# Patient Record
Sex: Male | Born: 1985 | Race: White | Hispanic: No | Marital: Single | Smoking: Never smoker
Health system: Southern US, Community
[De-identification: ages and names within clinical notes are randomized; demographics above are authoritative.]

## PROBLEM LIST (undated history)

## (undated) DIAGNOSIS — I872 Venous insufficiency (chronic) (peripheral): Secondary | ICD-10-CM

## (undated) HISTORY — PX: SHOULDER SURGERY: SHX246

## (undated) HISTORY — PX: FIBULA FRACTURE SURGERY: SHX947

## (undated) HISTORY — PX: EYE SURGERY: SHX253

## (undated) HISTORY — PX: KNEE SURGERY: SHX244

## (undated) HISTORY — PX: TIBIA FRACTURE SURGERY: SHX806

---

## 2007-01-27 ENCOUNTER — Emergency Department (HOSPITAL_COMMUNITY): Admission: EM | Admit: 2007-01-27 | Discharge: 2007-01-27 | Payer: Self-pay | Admitting: Emergency Medicine

## 2013-09-24 ENCOUNTER — Emergency Department: Payer: Self-pay | Admitting: Emergency Medicine

## 2013-11-05 ENCOUNTER — Emergency Department: Payer: Self-pay | Admitting: Emergency Medicine

## 2016-01-27 ENCOUNTER — Encounter: Payer: Self-pay | Admitting: *Deleted

## 2016-01-27 ENCOUNTER — Emergency Department
Admission: EM | Admit: 2016-01-27 | Discharge: 2016-01-27 | Disposition: A | Discharge status: Home / Self Care | Payer: BC Managed Care – PPO | Attending: Emergency Medicine | Admitting: Emergency Medicine

## 2016-01-27 DIAGNOSIS — X58XXXA Exposure to other specified factors, initial encounter: Secondary | ICD-10-CM | POA: Diagnosis not present

## 2016-01-27 DIAGNOSIS — S46912A Strain of unspecified muscle, fascia and tendon at shoulder and upper arm level, left arm, initial encounter: Secondary | ICD-10-CM

## 2016-01-27 DIAGNOSIS — S46812A Strain of other muscles, fascia and tendons at shoulder and upper arm level, left arm, initial encounter: Secondary | ICD-10-CM | POA: Insufficient documentation

## 2016-01-27 DIAGNOSIS — F410 Panic disorder [episodic paroxysmal anxiety] without agoraphobia: Secondary | ICD-10-CM

## 2016-01-27 DIAGNOSIS — Y939 Activity, unspecified: Secondary | ICD-10-CM | POA: Insufficient documentation

## 2016-01-27 DIAGNOSIS — Y929 Unspecified place or not applicable: Secondary | ICD-10-CM | POA: Diagnosis not present

## 2016-01-27 DIAGNOSIS — Y990 Civilian activity done for income or pay: Secondary | ICD-10-CM | POA: Diagnosis not present

## 2016-01-27 MED ORDER — MELOXICAM 15 MG PO TABS: 15.0000 mg | ORAL_TABLET | Freq: Every day | ORAL | Status: DC

## 2016-01-27 NOTE — Discharge Instructions (Signed)
Muscle Strain A muscle strain is an injury that occurs when a muscle is stretched beyond its normal length. Usually a small number of muscle fibers are torn when this happens. Muscle strain is rated in degrees. First-degree strains have the least amount of muscle fiber tearing and pain. Second-degree and third-degree strains have increasingly more tearing and pain.  Usually, recovery from muscle strain takes 1-2 weeks. Complete healing takes 5-6 weeks.  CAUSES  Muscle strain happens when a sudden, violent force placed on a muscle stretches it too far. This may occur with lifting, sports, or a fall.  RISK FACTORS Muscle strain is especially common in athletes.  SIGNS AND SYMPTOMS At the site of the muscle strain, there may be:  Pain.  Bruising.  Swelling.  Difficulty using the muscle due to pain or lack of normal function. DIAGNOSIS  Your health care provider will perform a physical exam and ask about your medical history. TREATMENT  Often, the best treatment for a muscle strain is resting, icing, and applying cold compresses to the injured area.  HOME CARE INSTRUCTIONS   Use the PRICE method of treatment to promote muscle healing during the first 2-3 days after your injury. The PRICE method involves:  Protecting the muscle from being injured again.  Restricting your activity and resting the injured body part.  Icing your injury. To do this, put ice in a plastic bag. Place a towel between your skin and the bag. Then, apply the ice and leave it on from 15-20 minutes each hour. After the third day, switch to moist heat packs.  Apply compression to the injured area with a splint or elastic bandage. Be careful not to wrap it too tightly. This may interfere with blood circulation or increase swelling.  Elevate the injured body part above the level of your heart as often as you can.  Only take over-the-counter or prescription medicines for pain, discomfort, or fever as directed by your  health care provider.  Warming up prior to exercise helps to prevent future muscle strains. SEEK MEDICAL CARE IF:   You have increasing pain or swelling in the injured area.  You have numbness, tingling, or a significant loss of strength in the injured area. MAKE SURE YOU:   Understand these instructions.  Will watch your condition.  Will get help right away if you are not doing well or get worse.   This information is not intended to replace advice given to you by your health care provider. Make sure you discuss any questions you have with your health care provider.   Document Released: 09/26/2005 Document Revised: 07/17/2013 Document Reviewed: 04/25/2013 Elsevier Interactive Patient Education 2016 [employer]  Panic Attacks Panic attacks are sudden, short-livedsurges of severe anxiety, fear, or discomfort. They may occur for no reason when you are relaxed, when you are anxious, or when you are sleeping. Panic attacks may occur for a number of reasons:   Healthy people occasionally have panic attacks in extreme, life-threatening situations, such as war or natural disasters. Normal anxiety is a protective mechanism of the body that helps us react to danger (fight or flight response).  Panic attacks are often seen with anxiety disorders, such as panic disorder, social anxiety disorder, generalized anxiety disorder, and phobias. Anxiety disorders cause excessive or uncontrollable anxiety. They may interfere with your relationships or other life activities.  Panic attacks are sometimes seen with other mental illnesses, such as depression and posttraumatic stress disorder.  Certain medical conditions, prescription medicines, and  drugs of abuse can cause panic attacks. SYMPTOMS  Panic attacks start suddenly, peak within 20 minutes, and are accompanied by four or more of the following symptoms:  Pounding heart or fast heart rate (palpitations).  Sweating.  Trembling or  shaking.  Shortness of breath or feeling smothered.  Feeling choked.  Chest pain or discomfort.  Nausea or strange feeling in your stomach.  Dizziness, light-headedness, or feeling like you will faint.  Chills or hot flushes.  Numbness or tingling in your lips or hands and feet.  Feeling that things are not real or feeling that you are not yourself.  Fear of losing control or going crazy.  Fear of dying. Some of these symptoms can mimic serious medical conditions. For example, you may think you are having a heart attack. Although panic attacks can be very scary, they are not life threatening. DIAGNOSIS  Panic attacks are diagnosed through an assessment by your health care provider. Your health care provider will ask questions about your symptoms, such as where and when they occurred. Your health care provider will also ask about your medical history and use of alcohol and drugs, including prescription medicines. Your health care provider may order blood tests or other studies to rule out a serious medical condition. Your health care provider may refer you to a mental health professional for further evaluation. TREATMENT   Most healthy people who have one or two panic attacks in an extreme, life-threatening situation will not require treatment.  The treatment for panic attacks associated with anxiety disorders or other mental illness typically involves counseling with a mental health professional, medicine, or a combination of both. Your health care provider will help determine what treatment is best for you.  Panic attacks due to physical illness usually go away with treatment of the illness. If prescription medicine is causing panic attacks, talk with your health care provider about stopping the medicine, decreasing the dose, or substituting another medicine.  Panic attacks due to alcohol or drug abuse go away with abstinence. Some adults need professional help in order to stop  drinking or using drugs. HOME CARE INSTRUCTIONS   Take all medicines as directed by your health care provider.   Schedule and attend follow-up visits as directed by your health care provider. It is important to keep all your appointments. SEEK MEDICAL CARE IF:  You are not able to take your medicines as prescribed.  Your symptoms do not improve or get worse. SEEK IMMEDIATE MEDICAL CARE IF:   You experience panic attack symptoms that are different than your usual symptoms.  You have serious thoughts about hurting yourself or others.  You are taking medicine for panic attacks and have a serious side effect. MAKE SURE YOU:  Understand these instructions.  Will watch your condition.  Will get help right away if you are not doing well or get worse.   This information is not intended to replace advice given to you by your health care provider. Make sure you discuss any questions you have with your health care provider.   Document Released: 09/26/2005 Document Revised: 10/01/2013 Document Reviewed: 05/10/2013 Elsevier Interactive Patient Education [employer]

## 2016-01-27 NOTE — ED Notes (Signed)
Pt reports he had a panic attack today.  Pt reports he had episode today of tremors.  Pt states i have a stressful job and now i need a note for work. Pt calm and cooperative.
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
## 2016-09-26 ENCOUNTER — Ambulatory Visit (INDEPENDENT_AMBULATORY_CARE_PROVIDER_SITE_OTHER): Payer: BC Managed Care – PPO | Admitting: Vascular Surgery

## 2016-09-26 ENCOUNTER — Encounter (INDEPENDENT_AMBULATORY_CARE_PROVIDER_SITE_OTHER): Payer: Self-pay | Admitting: Vascular Surgery

## 2016-09-26 ENCOUNTER — Encounter (INDEPENDENT_AMBULATORY_CARE_PROVIDER_SITE_OTHER): Payer: Self-pay

## 2016-09-26 VITALS — BP 120/76 | HR 65 | Resp 17 | Ht 73.0 in | Wt 330.0 lb

## 2016-09-26 DIAGNOSIS — I872 Venous insufficiency (chronic) (peripheral): Secondary | ICD-10-CM

## 2016-09-26 DIAGNOSIS — I8393 Asymptomatic varicose veins of bilateral lower extremities: Secondary | ICD-10-CM

## 2016-09-26 DIAGNOSIS — M7989 Other specified soft tissue disorders: Secondary | ICD-10-CM
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
## 2016-10-24 ENCOUNTER — Ambulatory Visit (INDEPENDENT_AMBULATORY_CARE_PROVIDER_SITE_OTHER): Payer: Self-pay | Admitting: Vascular Surgery

## 2016-10-24 ENCOUNTER — Encounter (INDEPENDENT_AMBULATORY_CARE_PROVIDER_SITE_OTHER): Payer: BC Managed Care – PPO

## 2016-10-24 ENCOUNTER — Encounter (INDEPENDENT_AMBULATORY_CARE_PROVIDER_SITE_OTHER): Payer: Self-pay

## 2017-01-20 ENCOUNTER — Encounter: Payer: Self-pay | Admitting: Medical Oncology

## 2017-01-20 ENCOUNTER — Emergency Department
Admission: EM | Admit: 2017-01-20 | Discharge: 2017-01-20 | Disposition: A | Discharge status: Home / Self Care | Payer: BC Managed Care – PPO | Attending: Emergency Medicine | Admitting: Emergency Medicine

## 2017-01-20 DIAGNOSIS — J069 Acute upper respiratory infection, unspecified: Secondary | ICD-10-CM | POA: Insufficient documentation

## 2017-01-20 DIAGNOSIS — B9789 Other viral agents as the cause of diseases classified elsewhere: Secondary | ICD-10-CM

## 2017-01-20 DIAGNOSIS — L309 Dermatitis, unspecified: Secondary | ICD-10-CM

## 2017-01-20 MED ORDER — GUAIFENESIN-CODEINE 100-10 MG/5ML PO SOLN: 5.0000 mL | ORAL | 0 refills | Status: DC | PRN

## 2017-01-20 MED ORDER — TRIAMCINOLONE ACETONIDE 0.5 % EX OINT: 1.0000 "application " | TOPICAL_OINTMENT | Freq: Two times a day (BID) | CUTANEOUS | 0 refills | Status: DC

## 2017-01-20 MED ORDER — ONDANSETRON 4 MG PO TBDP: 4.0000 mg | ORAL_TABLET | Freq: Three times a day (TID) | ORAL | 0 refills | Status: DC | PRN

## 2017-01-20 MED ORDER — ONDANSETRON 8 MG PO TBDP
8.0000 mg | ORAL_TABLET | Freq: Once | ORAL | Status: AC
  Administered 2017-01-20: 8 mg via ORAL
  Filled 2017-01-20: qty 1

## 2017-01-20 NOTE — ED Triage Notes (Signed)
Pt reports sore throat, cough, congestion since Tuesday. Also reports rash that is itchy to both feet.

## 2017-01-20 NOTE — ED Provider Notes (Signed)
[facility] Emergency Department Provider Note   ____________________________________________   First MD Initiated Contact with Patient 01/20/17 [phone]     (approximate)  I have reviewed the triage vital signs and the nursing notes.   HISTORY  Chief Complaint Cough; Nasal Congestion; Sore Throat; and Rash    HPI Jacob Richard is a 31 y.o. male is here complaining of sore throat, cough, congestion for the last 3 days. Patient denies any fever. He has been taking Mucinex for his symptoms without any relief. He states that he has been coughing a great deal and occasionally has vomited because of the coughing. This morning was last time he vomited. Patient voices that he is still nauseous at this time. He denies any diarrhea. Patient also is requesting that the rash on his feet be looked at also. Rash is been on his feet for the last 2-3 months. Patient states that it itches a great deal and seems to be getting worse. Patient thought it was ringworm and has been using over-the-counter cream without any relief or improvement. He denies having a PCP. Patient is a nonsmoker. At this time he denies any pain.   History reviewed. No pertinent past medical history.  Patient Active Problem List   Diagnosis Date Noted  . Chronic venous insufficiency 09/26/2016  . Swelling of lower extremity 09/26/2016  . Varicose veins of both lower extremities 09/26/2016    No past surgical history on file.  Prior to Admission medications   Medication Sig Start Date End Date Taking? Authorizing Provider  guaiFENesin-codeine 100-10 MG/5ML syrup Take 5 mLs by mouth every 4 (four) hours as needed. 01/20/17   Tommi Rumps, PA-C  Multiple Vitamin (MULTIVITAMIN) capsule Take by mouth.    Historical Provider, MD  ondansetron (ZOFRAN ODT) 4 MG disintegrating tablet Take 1 tablet (4 mg total) by mouth every 8 (eight) hours as needed for nausea or vomiting. 01/20/17   Tommi Rumps, PA-C    triamcinolone ointment (KENALOG) 0.5 % Apply 1 application topically 2 (two) times daily. 01/20/17   Tommi Rumps, PA-C    Allergies Patient has no known allergies.  No family history on file.  Social History Social History  Substance Use Topics  . Smoking status: Never Smoker  . Smokeless tobacco: Not on file  . Alcohol use Yes    Review of Systems Constitutional: No fever/chills Eyes: No visual changes. ENT: Positive sore throat. Positive nasal congestion. Cardiovascular: Denies chest pain. Respiratory: Denies shortness of breath. Positive cough. Gastrointestinal: No abdominal pain.  Positive nausea, positive vomiting.  No diarrhea.   Musculoskeletal: Negative for back pain. Skin: Negative for rash. Neurological: Negative for headaches, focal weakness or numbness.  10-point ROS otherwise negative.  ____________________________________________   PHYSICAL EXAM:  VITAL SIGNS: ED Triage Vitals [01/20/17 0755]  Enc Vitals Group     BP 128/68     Pulse Rate 84     Resp 18     Temp 98.6 F (37 C)     Temp Source Oral     SpO2 97 %     Weight (!) 320 lb (145.2 kg)     Height  (1.854 m)     Head Circumference      Peak Flow      Pain Score 0     Pain Loc      Pain Edu?      Excl. in GC?     Constitutional: Alert and oriented. Well  appearing and in no acute distress. Eyes: Conjunctivae are normal. PERRL. EOMI. Head: Atraumatic. Nose: Moderate congestion/no rhinnorhea.  EACs and TMs are clear bilaterally. Mouth/Throat: Mucous membranes are moist.  Oropharynx non-erythematous. Posterior drainage seen. Neck: No stridor.   Hematological/Lymphatic/Immunilogical: No cervical lymphadenopathy. Cardiovascular: Normal rate, regular rhythm. Grossly normal heart sounds.  Good peripheral circulation. Respiratory: Normal respiratory effort.  No retractions. Lungs CTAB. Gastrointestinal: Soft and nontender. No distention. Bowel sounds normoactive 4  quadrants. Musculoskeletal: No lower extremity tenderness nor edema.  No joint effusions. Neurologic:  Normal speech and language. No gross focal neurologic deficits are appreciated. No gait instability. Skin:  Skin is warm, dry and intact. Dorsal aspect of both feet have erythematous patches that are extremely dry. Margins are clear defined. No drainage is noted. No warmth or tenderness noted. Psychiatric: Mood and affect are normal. Speech and behavior are normal.  ____________________________________________   LABS (all labs ordered are listed, but only abnormal results are displayed)  Labs Reviewed - No data to display   PROCEDURES  Procedure(s) performed: None  Procedures  Critical Care performed: No  ____________________________________________   INITIAL IMPRESSION / ASSESSMENT AND PLAN / ED COURSE  Pertinent labs & imaging results that were available during my care of the patient were reviewed by me and considered in my medical decision making (see chart for details).  Patient was given Zofran while in the emergency room with improvement of his nausea. He was discharged with prescription for Zofran 4 mg ODT every 8 hours as needed for nausea.  He was given a prescription for Robitussin-AC 1 teaspoon every 4 hours as needed for cough and congestion. And for his skin infection on his feet he was given a prescription for Kenalog 0.5% apply to area twice a day. He was given instructions to follow-up with one of the dermatologist in [place] and their phone numbers were given to him.      ____________________________________________   FINAL CLINICAL IMPRESSION(S) / ED DIAGNOSES  Final diagnoses:  Viral URI with cough  Eczema, unspecified type      NEW MEDICATIONS STARTED DURING THIS VISIT:  Discharge Medication List as of 01/20/2017  8:22 AM    START taking these medications   Details  guaiFENesin-codeine 100-10 MG/5ML syrup Take 5 mLs by mouth every 4 (four)  hours as needed., Starting Fri 01/20/2017, Print    ondansetron (ZOFRAN ODT) 4 MG disintegrating tablet Take 1 tablet (4 mg total) by mouth every 8 (eight) hours as needed for nausea or vomiting., Starting Fri 01/20/2017, Print    triamcinolone ointment (KENALOG) 0.5 % Apply 1 application topically 2 (two) times daily., Starting Fri 01/20/2017, Print         Note:  This document was prepared using Dragon voice recognition software and may include unintentional dictation errors.    Tommi Rumps, PA-C 01/20/17 1224    Jeanmarie Plant, MD 01/20/17 [phone]

## 2017-01-20 NOTE — Discharge Instructions (Signed)
Clear liquids for the remainder of the day. If able to keep liquids down and then advance diet slowly to crackers, toast, rice, applesauce. Tylenol if needed for body aches or fever. Zofran if needed for nausea or vomiting. Being using cream to your feet twice a day. Call and make an appointment with Dr. Durene Cal office or Dr. Philemon Kingdom office. You will need to see a dermatologist if not improving.

## 2018-07-11 ENCOUNTER — Encounter: Payer: Self-pay | Admitting: Emergency Medicine

## 2018-07-11 ENCOUNTER — Ambulatory Visit
Admission: EM | Admit: 2018-07-11 | Discharge: 2018-07-11 | Disposition: A | Discharge status: Home / Self Care | Payer: BLUE CROSS/BLUE SHIELD | Attending: Family Medicine | Admitting: Family Medicine

## 2018-07-11 ENCOUNTER — Other Ambulatory Visit: Payer: Self-pay

## 2018-07-11 DIAGNOSIS — R030 Elevated blood-pressure reading, without diagnosis of hypertension: Secondary | ICD-10-CM | POA: Diagnosis not present

## 2018-07-11 DIAGNOSIS — I872 Venous insufficiency (chronic) (peripheral): Secondary | ICD-10-CM | POA: Diagnosis not present

## 2018-07-11 DIAGNOSIS — L0889 Other specified local infections of the skin and subcutaneous tissue: Secondary | ICD-10-CM

## 2018-07-11 DIAGNOSIS — L03116 Cellulitis of left lower limb: Secondary | ICD-10-CM | POA: Diagnosis not present

## 2018-07-11 HISTORY — DX: Rider (driver) (passenger) of other motorcycle injured in unspecified traffic accident, initial encounter: V29.99XA

## 2018-07-11 HISTORY — DX: Venous insufficiency (chronic) (peripheral): I87.2

## 2018-07-11 MED ORDER — CEPHALEXIN 500 MG PO CAPS: 500.0000 mg | ORAL_CAPSULE | Freq: Three times a day (TID) | ORAL | 0 refills | Status: AC
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
## 2018-07-11 NOTE — ED Triage Notes (Signed)
Patient c/o hitting his left leg on "something" and now has swelling and drainage to the left lower leg. Patient stated he has had the swelling x 1 week but the drainage started today. Patient has blister to the left lower ankle area.

## 2018-07-11 NOTE — Discharge Instructions (Addendum)
Recommend start Keflex 500mg  3 times a day as directed. Stop oral Prednisone for now. May continue to apply Kenalog (steroid) cream on other affected areas on left leg. Keep area clean and change dressings frequently. Keep left leg elevated as much as possible. May take Ibuprofen 600mg  every 6 hours as needed for pain. Follow-up with [facility] or your Dermatologist as soon as possible for recheck.

## 2021-05-11 ENCOUNTER — Emergency Department: Payer: BC Managed Care – PPO

## 2021-05-11 ENCOUNTER — Other Ambulatory Visit: Payer: Self-pay

## 2021-05-11 ENCOUNTER — Emergency Department
Admission: EM | Admit: 2021-05-11 | Discharge: 2021-05-11 | Disposition: A | Discharge status: Home / Self Care | Payer: BC Managed Care – PPO | Attending: Emergency Medicine | Admitting: Emergency Medicine

## 2021-05-11 DIAGNOSIS — R059 Cough, unspecified: Secondary | ICD-10-CM | POA: Diagnosis present

## 2021-05-11 DIAGNOSIS — R112 Nausea with vomiting, unspecified: Secondary | ICD-10-CM | POA: Diagnosis not present

## 2021-05-11 DIAGNOSIS — R1012 Left upper quadrant pain: Secondary | ICD-10-CM | POA: Insufficient documentation

## 2021-05-11 DIAGNOSIS — U071 COVID-19: Secondary | ICD-10-CM | POA: Diagnosis not present

## 2021-05-11 DIAGNOSIS — R000 Tachycardia, unspecified: Secondary | ICD-10-CM | POA: Insufficient documentation

## 2021-05-11 LAB — COMPREHENSIVE METABOLIC PANEL
ALT: 35 U/L (ref 0–44)
AST: 29 U/L (ref 15–41)
Albumin: 4.6 g/dL (ref 3.5–5.0)
Alkaline Phosphatase: 81 U/L (ref 38–126)
Anion gap: 5 (ref 5–15)
BUN: 11 mg/dL (ref 6–20)
CO2: 23 mmol/L (ref 22–32)
Calcium: 9.3 mg/dL (ref 8.9–10.3)
Chloride: 107 mmol/L (ref 98–111)
Creatinine, Ser: 0.98 mg/dL (ref 0.61–1.24)
GFR, Estimated: >60 mL/min (ref >60)
Glucose, Bld: 109 mg/dL — ABNORMAL HIGH (ref 70–99)
Potassium: 4.2 mmol/L (ref 3.5–5.1)
Sodium: 135 mmol/L (ref 135–145)
Total Bilirubin: 0.6 mg/dL (ref 0.3–1.2)
Total Protein: 7.9 g/dL (ref 6.5–8.1)

## 2021-05-11 LAB — CBC
HCT: 41.9 % (ref 39.0–52.0)
Hemoglobin: 14.2 g/dL (ref 13.0–17.0)
MCH: 28.9 pg (ref 26.0–34.0)
MCHC: 33.9 g/dL (ref 30.0–36.0)
MCV: 85.2 fL (ref 80.0–100.0)
Platelets: 206 10*3/uL (ref 150–400)
RBC: 4.92 MIL/uL (ref 4.22–5.81)
RDW: 12.2 % (ref 11.5–15.5)
WBC: 7.1 10*3/uL (ref 4.0–10.5)
nRBC: 0 % (ref 0.0–0.2)

## 2021-05-11 LAB — URINALYSIS, COMPLETE (UACMP) WITH MICROSCOPIC
Bacteria, UA: NONE SEEN
Bilirubin Urine: NEGATIVE
Glucose, UA: NEGATIVE mg/dL
Hgb urine dipstick: NEGATIVE
Ketones, ur: NEGATIVE mg/dL
Leukocytes,Ua: NEGATIVE
Nitrite: NEGATIVE
Protein, ur: NEGATIVE mg/dL
Specific Gravity, Urine: 1.02 (ref 1.005–1.030)
pH: 5 (ref 5.0–8.0)

## 2021-05-11 LAB — RESP PANEL BY RT-PCR (FLU A&B, COVID) ARPGX2
Influenza A by PCR: NEGATIVE
Influenza B by PCR: NEGATIVE
SARS Coronavirus 2 by RT PCR: POSITIVE — AB

## 2021-05-11 LAB — LIPASE, BLOOD: Lipase: 27 U/L (ref 11–51)

## 2021-05-11 MED ORDER — PANTOPRAZOLE SODIUM 40 MG PO TBEC
40.0000 mg | DELAYED_RELEASE_TABLET | Freq: Once | ORAL | Status: AC
  Administered 2021-05-11: 40 mg via ORAL
  Filled 2021-05-11: qty 1

## 2021-05-11 MED ORDER — SUCRALFATE 1 G PO TABS
1.0000 g | ORAL_TABLET | Freq: Once | ORAL | Status: AC
  Administered 2021-05-11: 1 g via ORAL
  Filled 2021-05-11: qty 1

## 2021-05-11 MED ORDER — FENTANYL CITRATE (PF) 100 MCG/2ML IJ SOLN
50.0000 ug | Freq: Once | INTRAMUSCULAR | Status: DC
  Filled 2021-05-11 (×2): qty 2

## 2021-05-11 MED ORDER — LACTATED RINGERS IV BOLUS
1000.0000 mL | Freq: Once | INTRAVENOUS | Status: AC
  Administered 2021-05-11: 1000 mL via INTRAVENOUS

## 2021-05-11 MED ORDER — IOHEXOL 350 MG/ML SOLN
100.0000 mL | Freq: Once | INTRAVENOUS | Status: AC | PRN
  Administered 2021-05-11: 100 mL via INTRAVENOUS
  Filled 2021-05-11: qty 100

## 2021-05-11 MED ORDER — ONDANSETRON HCL 4 MG PO TABS: 4.0000 mg | ORAL_TABLET | Freq: Three times a day (TID) | ORAL | 0 refills | Status: AC | PRN

## 2021-05-11 MED ORDER — PAXLOVID 10 X 150 MG & 10 X 100MG PO TBPK: 2.0000 | ORAL_TABLET | Freq: Two times a day (BID) | ORAL | 0 refills | Status: AC
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
## 2021-05-11 NOTE — ED Triage Notes (Signed)
Pt to ED for n/v and LLQ pain that started an hour ago. Pt inNAD

## 2022-01-15 IMAGING — CR DG CHEST 2V
1 series · 2 of 2 positions shown · non-contrast
Comparison: 11/05/2013

CLINICAL DATA: Cough.  Abdominal pain.

EXAM:
CHEST - 2 VIEW

[Series 1: dg chest 2 view · 0.14mm/px · 2 of 2 slices shown]
[im 1/2]
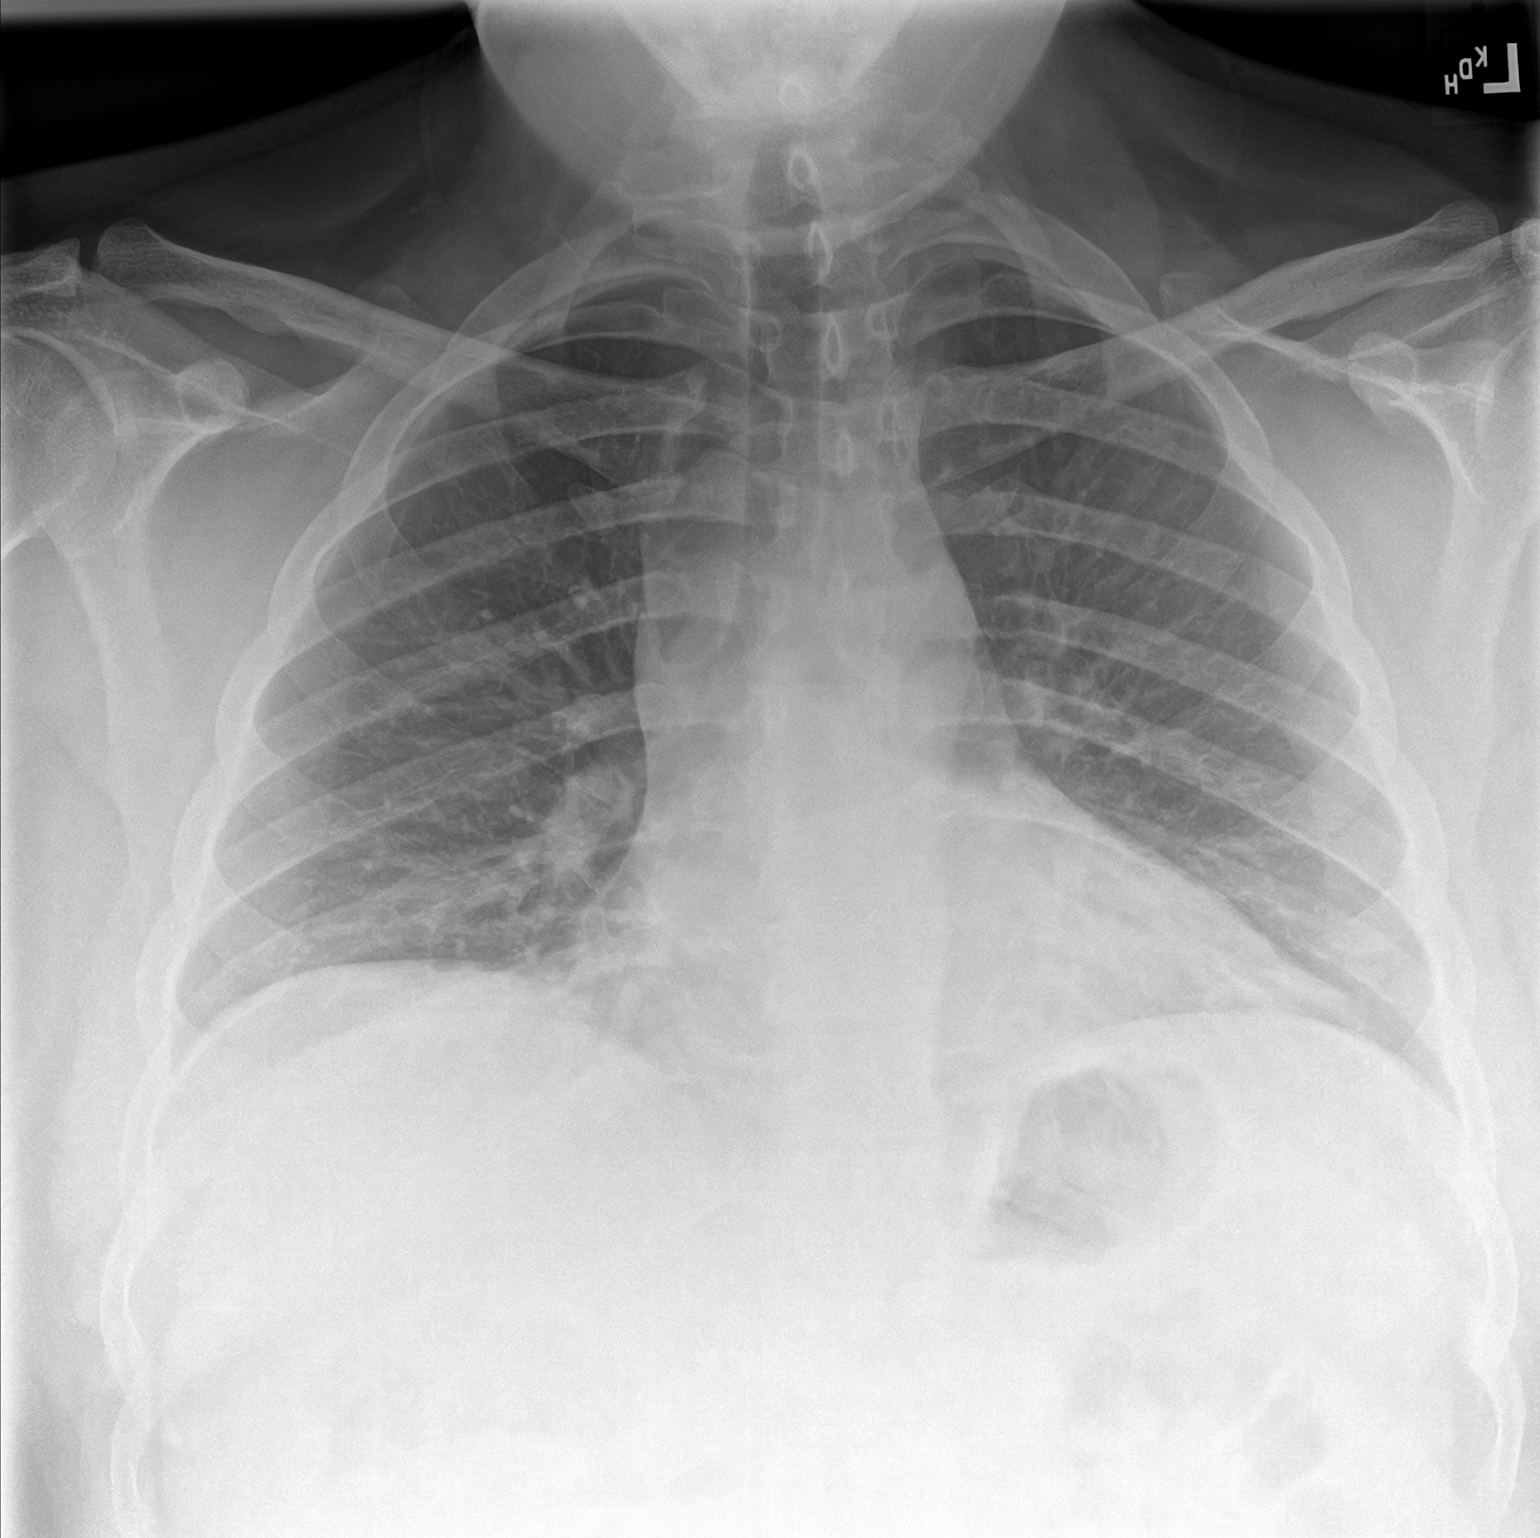
[im 2/2]
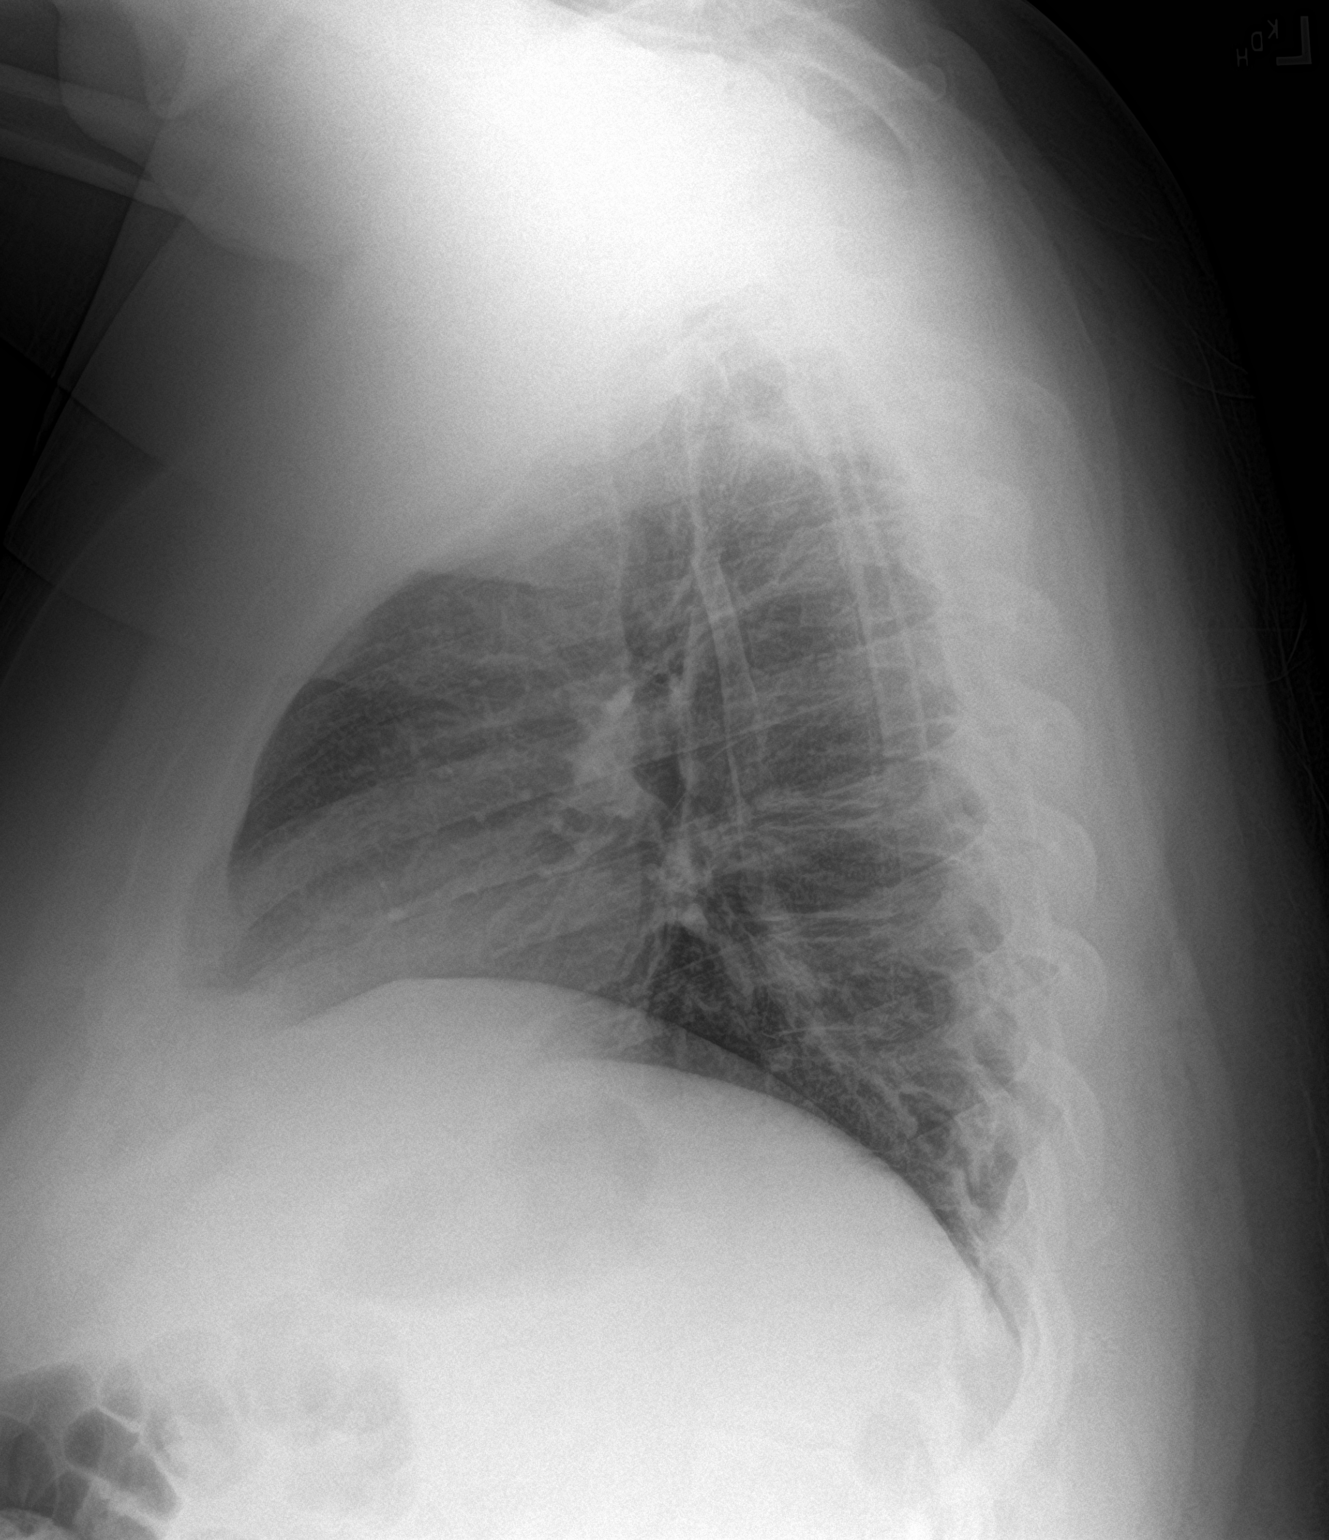

[2 of 2 positions shown; findings below may reference images not displayed]

FINDINGS: Heart size is normal. Mediastinal shadows are normal. The lungs are
clear. No bronchial thickening. No infiltrate, mass, effusion or
collapse. Pulmonary vascularity is normal. No bony abnormality.
IMPRESSION: Normal chest

## 2022-03-21 ENCOUNTER — Other Ambulatory Visit: Payer: Self-pay | Admitting: Gerontology

## 2022-03-21 ENCOUNTER — Ambulatory Visit
Admission: RE | Admit: 2022-03-21 | Discharge: 2022-03-21 | Disposition: A | Discharge status: Home / Self Care | Payer: BC Managed Care – PPO | Source: Ambulatory Visit | Attending: Gerontology | Admitting: Gerontology

## 2022-03-21 DIAGNOSIS — I83812 Varicose veins of left lower extremities with pain: Secondary | ICD-10-CM | POA: Insufficient documentation

## 2022-03-21 DIAGNOSIS — R600 Localized edema: Secondary | ICD-10-CM

## 2022-08-14 ENCOUNTER — Emergency Department: Payer: BC Managed Care – PPO

## 2022-08-14 ENCOUNTER — Other Ambulatory Visit: Payer: Self-pay

## 2022-08-14 ENCOUNTER — Emergency Department
Admission: EM | Admit: 2022-08-14 | Discharge: 2022-08-14 | Disposition: A | Discharge status: Home / Self Care | Payer: BC Managed Care – PPO | Attending: Emergency Medicine | Admitting: Emergency Medicine

## 2022-08-14 DIAGNOSIS — S99922A Unspecified injury of left foot, initial encounter: Secondary | ICD-10-CM | POA: Diagnosis present

## 2022-08-14 DIAGNOSIS — W1789XA Other fall from one level to another, initial encounter: Secondary | ICD-10-CM | POA: Diagnosis not present
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
## 2022-08-14 NOTE — Discharge Instructions (Signed)
You were seen today for left foot pain status post an injury.  Your x-ray did not show any evidence of fracture of the foot or the ankle.  We applied an Ace wrap for comfort.  We recommend rest, ice and elevation until pain improves.  Please follow-up if your symptoms persist or worsen.

## 2022-08-14 NOTE — ED Triage Notes (Signed)
Pt presents via POV c/o left ankle pain. Reports fell through ramp of a deck.
